# Patient Record
Sex: Female | Born: 1970 | Race: White | Hispanic: No | Smoking: Never smoker
Health system: Southern US, Community
[De-identification: ages and names within clinical notes are randomized; demographics above are authoritative.]

## PROBLEM LIST (undated history)

## (undated) DIAGNOSIS — N12 Tubulo-interstitial nephritis, not specified as acute or chronic: Secondary | ICD-10-CM

## (undated) DIAGNOSIS — B977 Papillomavirus as the cause of diseases classified elsewhere: Secondary | ICD-10-CM

## (undated) DIAGNOSIS — B001 Herpesviral vesicular dermatitis: Secondary | ICD-10-CM

## (undated) HISTORY — DX: Herpesviral vesicular dermatitis: B00.1

## (undated) HISTORY — DX: Papillomavirus as the cause of diseases classified elsewhere: B97.7

## (undated) HISTORY — DX: Tubulo-interstitial nephritis, not specified as acute or chronic: N12

---

## 2003-12-25 ENCOUNTER — Ambulatory Visit: Payer: Self-pay | Admitting: Unknown Physician Specialty

## 2004-12-23 ENCOUNTER — Ambulatory Visit: Payer: Self-pay | Admitting: Unknown Physician Specialty

## 2006-07-20 ENCOUNTER — Ambulatory Visit: Payer: Self-pay | Admitting: Unknown Physician Specialty

## 2007-08-23 ENCOUNTER — Ambulatory Visit: Payer: Self-pay | Admitting: Unknown Physician Specialty

## 2008-09-11 ENCOUNTER — Ambulatory Visit: Payer: Self-pay | Admitting: Unknown Physician Specialty

## 2009-10-08 ENCOUNTER — Ambulatory Visit: Payer: Self-pay | Admitting: Unknown Physician Specialty

## 2010-04-29 ENCOUNTER — Ambulatory Visit: Payer: Self-pay | Admitting: Internal Medicine

## 2010-10-28 ENCOUNTER — Ambulatory Visit: Payer: Self-pay | Admitting: Unknown Physician Specialty

## 2013-11-14 ENCOUNTER — Ambulatory Visit: Payer: Self-pay | Admitting: Unknown Physician Specialty

## 2014-12-02 ENCOUNTER — Encounter: Payer: Self-pay | Admitting: Podiatry

## 2014-12-02 ENCOUNTER — Ambulatory Visit (INDEPENDENT_AMBULATORY_CARE_PROVIDER_SITE_OTHER): Payer: Self-pay | Admitting: Podiatry

## 2014-12-02 VITALS — BP 128/75 | HR 79 | Resp 18

## 2014-12-02 DIAGNOSIS — B351 Tinea unguium: Secondary | ICD-10-CM

## 2014-12-02 MED ORDER — TERBINAFINE HCL 250 MG PO TABS: 250.0000 mg | ORAL_TABLET | Freq: Every day | ORAL | Status: DC

## 2014-12-02 NOTE — Progress Notes (Signed)
   Subjective:    Patient ID: Yvonne Dunlap, female    DOB: September 12, 1970, 44 y.o.   MRN: [id number]  HPI I HAVE SOME TOENAIL FUNGUS ON BOTH BIG TOES AND THE LEFT IS THE WORST AND I DO GET PEDICURES AND THERE IS DISCOLORATION AS WELL. She states that this is been present for the past 6 months and I did not realize was then selected the toenail polish off.    Review of Systems  All other systems reviewed and are negative.      Objective:   Physical Exam: 44 year old female in no apparent distress. Pulses are strongly palpable bilateral. Neurologic sensorium is intact her Semmes-Weinstein monofilament. Deep tendon reflexes are intact and brisk bilateral. Muscle strength +5 over 5 dorsiflexion plantar flexors and inverters everters all intrinsic musculature is intact. Orthopedic evaluation demonstrate all joints distal to the ankle in full range of motion without crepitation. Cutaneous evaluation demonstrates supple well-hydrated cutis she does appear to have yellow dystrophic probably mycotic nail plate hallux bilateral. Only approximately half to distal aspect of the nail plate is affected.      Assessment & Plan:  Onychomycosis hallux bilateral.  Plan: Started her on Lamisil 250 mg tablets 1 by mouth daily for the next 3 months.

## 2015-04-07 ENCOUNTER — Ambulatory Visit: Payer: Self-pay | Admitting: Podiatry

## 2015-04-14 ENCOUNTER — Ambulatory Visit: Payer: Self-pay | Admitting: Podiatry

## 2015-05-05 ENCOUNTER — Encounter: Payer: Self-pay | Admitting: Podiatry

## 2015-05-05 ENCOUNTER — Ambulatory Visit (INDEPENDENT_AMBULATORY_CARE_PROVIDER_SITE_OTHER): Payer: Self-pay | Admitting: Podiatry

## 2015-05-05 ENCOUNTER — Telehealth: Payer: Self-pay | Admitting: *Deleted

## 2015-05-05 DIAGNOSIS — B351 Tinea unguium: Secondary | ICD-10-CM

## 2015-05-05 NOTE — Progress Notes (Signed)
She presents today for follow-up of onychomycosis. She states that my small toes cleared over the hallux of the left foot did not clear up as well.  Objective: Vital signs are stable she is alert and oriented 3. She still has a subungual spot through the hallux nail plate left. No history of psoriasis.  Assessment: Probable onychomycosis associated with the yeast door mold since terbinafine failed to alleviate her symptoms.  Plan: At this point we will try to obtain Sporanox for her and we have requested our [facility] office to do so.

## 2015-05-05 NOTE — Telephone Encounter (Addendum)
-----   Message from [place] sent at 05/05/2015  1:18 PM EST ----- Regarding: Itraconazole  This patient has no insurance and Dr. Milinda Pointer is asking what is the lowest cost per month supply or 90 day supply would be for this patient. He is asking for you to check on this and give the pt a call with cost and if she agrees to go ahead and send in the Rx. Thanks!  05/10/2015-MAILED COPY OF PRICE LISTING for Itraconazole with website, cost, coupons and location for pricing of 60 doses, with instructions to call if she would like a rx.

## 2015-09-15 ENCOUNTER — Other Ambulatory Visit: Payer: Self-pay

## 2015-09-15 MED ORDER — ITRACONAZOLE 100 MG PO CAPS: 100.0000 mg | ORAL_CAPSULE | Freq: Two times a day (BID) | ORAL | Status: DC

## 2015-10-28 ENCOUNTER — Other Ambulatory Visit: Payer: Self-pay | Admitting: Podiatry

## 2015-10-29 ENCOUNTER — Telehealth: Payer: Self-pay | Admitting: *Deleted

## 2015-10-29 ENCOUNTER — Other Ambulatory Visit: Payer: Self-pay

## 2015-10-29 MED ORDER — ITRACONAZOLE 100 MG PO CAPS: 100.0000 mg | ORAL_CAPSULE | Freq: Two times a day (BID) | ORAL | 2 refills | Status: DC

## 2015-10-29 NOTE — Telephone Encounter (Signed)
Pt left name and phone number. Left message asking pt to call again.

## 2017-02-27 ENCOUNTER — Encounter: Payer: Self-pay | Admitting: Obstetrics and Gynecology

## 2017-02-27 ENCOUNTER — Ambulatory Visit (INDEPENDENT_AMBULATORY_CARE_PROVIDER_SITE_OTHER): Payer: Self-pay | Admitting: Obstetrics and Gynecology

## 2017-02-27 VITALS — BP 120/78 | HR 87 | Ht 67.0 in | Wt 204.0 lb

## 2017-02-27 DIAGNOSIS — N898 Other specified noninflammatory disorders of vagina: Secondary | ICD-10-CM

## 2017-02-27 MED ORDER — ACYCLOVIR 200 MG PO CAPS: 200.0000 mg | ORAL_CAPSULE | Freq: Every day | ORAL | 0 refills | Status: AC
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
## 2017-02-27 NOTE — Patient Instructions (Signed)
I value your feedback and entrusting us with your care. If you get a [facility] patient survey, I would appreciate you taking the time to let us know about your experience today. Thank you! 

## 2017-03-08 ENCOUNTER — Telehealth: Payer: Self-pay | Admitting: Obstetrics and Gynecology
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
## 2017-03-12 NOTE — Telephone Encounter (Signed)
LM with results. Recommend checking HSV 2 IgG.

## 2017-03-12 NOTE — Telephone Encounter (Signed)
Pt aware. Vag lesion improving, almost resolved. Will wait to see if sx recur before HSV 2 IgG testing due to self pay.

## 2017-06-01 DIAGNOSIS — D219 Benign neoplasm of connective and other soft tissue, unspecified: Secondary | ICD-10-CM

## 2017-06-01 HISTORY — DX: Benign neoplasm of connective and other soft tissue, unspecified: D21.9

## 2020-01-07 NOTE — Progress Notes (Signed)
Kirk Ruths, MD   Chief Complaint  Patient presents with  . Vaginal exam    dull ache in vag area x couple of months, would like pap if possible    HPI:      [name] is a 49 y.o. [id number] whose LMP was Patient's last menstrual period was 12/11/2019 (approximate)., presents today for vaginal ache (vs pelvic--hard to differentiate) for several months. Sx are intermittent but increasing in frequency. No aggrav/allev factors; hasn't tried NSAIDs. She is sex active, no pain/bleeding. No vag sx, urin sx, GI sx. No LBP, pelvic pain. Not doing heavy lifting. Not exercising.  Due for pap. Last pap neg cell/neg HPV DNA 09/2013. Hx of HPV on pap in past, no bx/tx.  Menses are monthly, lasting 3-4 days, no dysmen.   PT IS SELF PAY  Past Medical History:  Diagnosis Date  . Cold sore   . HPV (human papilloma virus) infection   . Pyelonephritis     Past Surgical History:  Procedure Laterality Date  . CESAREAN SECTION      Family History  Problem Relation Age of Onset  . Thyroid disease Mother   . Breast cancer Mother 17  . Diabetes Father   . Thyroid disease Sister     Social History   Socioeconomic History  . Marital status: Married    Spouse name: Not on file  . Number of children: Not on file  . Years of education: Not on file  . Highest education level: Not on file  Occupational History  . Not on file  Tobacco Use  . Smoking status: Never Smoker  . Smokeless tobacco: Never Used  Vaping Use  . Vaping Use: Never used  Substance and Sexual Activity  . Alcohol use: No    Alcohol/week: 0.0 standard drinks  . Drug use: No  . Sexual activity: Yes    Birth control/protection: None  Other Topics Concern  . Not on file  Social History Narrative  . Not on file   Social Determinants of Health   Financial Resource Strain:   . Difficulty of Paying Living Expenses: Not on file  Food Insecurity:   . Worried About Charity fundraiser in the Last Year: Not  on file  . Ran Out of Food in the Last Year: Not on file  Transportation Needs:   . Lack of Transportation (Medical): Not on file  . Lack of Transportation (Non-Medical): Not on file  Physical Activity:   . Days of Exercise per Week: Not on file  . Minutes of Exercise per Session: Not on file  Stress:   . Feeling of Stress : Not on file  Social Connections:   . Frequency of Communication with Friends and Family: Not on file  . Frequency of Social Gatherings with Friends and Family: Not on file  . Attends Religious Services: Not on file  . Active Member of Clubs or Organizations: Not on file  . Attends Archivist Meetings: Not on file  . Marital Status: Not on file  Intimate Partner Violence:   . Fear of Current or Ex-Partner: Not on file  . Emotionally Abused: Not on file  . Physically Abused: Not on file  . Sexually Abused: Not on file    Outpatient Medications Prior to Visit  Medication Sig Dispense Refill  . itraconazole (SPORANOX) 100 MG capsule Take 1 capsule (100 mg total) by mouth 2 (two) times daily. 30 capsule 2  . terbinafine (  LAMISIL) 250 MG tablet Take 1 tablet (250 mg total) by mouth daily. 90 tablet 0   No facility-administered medications prior to visit.      ROS:  Review of Systems  Constitutional: Negative for fever.  Gastrointestinal: Negative for blood in stool, constipation, diarrhea, nausea and vomiting.  Genitourinary: Positive for vaginal pain. Negative for dyspareunia, dysuria, flank pain, frequency, hematuria, urgency, vaginal bleeding and vaginal discharge.  Musculoskeletal: Negative for back pain.  Skin: Negative for rash.  BREAST: No symptoms   OBJECTIVE:   Vitals:  BP 130/90   Ht 5\' 7"  (1.702 m)   Wt 229 lb (103.9 kg)   LMP 12/11/2019 (Approximate)   BMI 35.87 kg/m   Physical Exam Vitals reviewed.  Constitutional:      Appearance: She is well-developed.  Pulmonary:     Effort: Pulmonary effort is normal.  Abdominal:       Palpations: Abdomen is soft.     Tenderness: There is no abdominal tenderness. There is no guarding or rebound.  Genitourinary:    General: Normal vulva.     Pubic Area: No rash.      Labia:        Right: No rash, tenderness or lesion.        Left: No rash, tenderness or lesion.      Vagina: Normal. No vaginal discharge, erythema or tenderness.     Cervix: Normal.     Uterus: Normal. Not enlarged and not tender.      Adnexa: Right adnexa normal and left adnexa normal.       Right: No mass or tenderness.         Left: No mass or tenderness.       Comments: EXAM DOESN'T CAUSE SX Musculoskeletal:        General: Normal range of motion.     Cervical back: Normal range of motion.  Skin:    General: Skin is warm and dry.  Neurological:     General: No focal deficit present.     Mental Status: She is alert and oriented to person, place, and time.  Psychiatric:        Mood and Affect: Mood normal.        Behavior: Behavior normal.        Thought Content: Thought content normal.        Judgment: Judgment normal.     Assessment/Plan: Vaginal pain--achy pain for several months. Neg exam. Check pap. If neg, question MSK. Try pelvic stretching/increase exercise in meantime. If sx persist, will check GYN u/s.   Cervical cancer screening - Plan: Other/Misc lab test; MDL pap due to self pay  Screening for HPV (human papillomavirus) - Plan: Other/Misc lab test  PT IS SELF PAY   Return if symptoms worsen or fail to improve.  Magdelyn Roebuck B. Sierra Bissonette, PA-C 01/08/2020 11:57 AM

## 2020-01-08 ENCOUNTER — Ambulatory Visit (INDEPENDENT_AMBULATORY_CARE_PROVIDER_SITE_OTHER): Payer: Self-pay | Admitting: Obstetrics and Gynecology

## 2020-01-08 ENCOUNTER — Encounter: Payer: Self-pay | Admitting: Obstetrics and Gynecology

## 2020-01-08 ENCOUNTER — Other Ambulatory Visit: Payer: Self-pay

## 2020-01-08 VITALS — BP 130/90 | Ht 67.0 in | Wt 229.0 lb

## 2020-01-08 DIAGNOSIS — Z1151 Encounter for screening for human papillomavirus (HPV): Secondary | ICD-10-CM

## 2020-01-08 DIAGNOSIS — R102 Pelvic and perineal pain: Secondary | ICD-10-CM

## 2020-01-08 DIAGNOSIS — Z124 Encounter for screening for malignant neoplasm of cervix: Secondary | ICD-10-CM

## 2020-01-08 NOTE — Patient Instructions (Signed)
I value your feedback and entrusting us with your care. If you get a [facility] patient survey, I would appreciate you taking the time to let us know about your experience today. Thank you!  As of February 20, 2019, your lab results will be released to your MyChart immediately, before I even have a chance to see them. Please give me time to review them and contact you if there are any abnormalities. Thank you for your patience.  

## 2020-01-12 ENCOUNTER — Encounter: Payer: Self-pay | Admitting: Obstetrics and Gynecology

## 2020-01-21 ENCOUNTER — Telehealth: Payer: Self-pay | Admitting: Obstetrics and Gynecology

## 2020-01-21 NOTE — Telephone Encounter (Signed)
LM with neg pap/neg HPV DNA results. Pt to f/u re: vaginal vs pelvic pain.

## 2021-01-13 ENCOUNTER — Other Ambulatory Visit: Payer: Self-pay

## 2021-01-13 ENCOUNTER — Ambulatory Visit (INDEPENDENT_AMBULATORY_CARE_PROVIDER_SITE_OTHER): Payer: Self-pay | Admitting: Dermatology

## 2021-01-13 DIAGNOSIS — D489 Neoplasm of uncertain behavior, unspecified: Secondary | ICD-10-CM

## 2021-01-13 DIAGNOSIS — L42 Pityriasis rosea: Secondary | ICD-10-CM

## 2021-01-13 DIAGNOSIS — D239 Other benign neoplasm of skin, unspecified: Secondary | ICD-10-CM

## 2021-01-13 DIAGNOSIS — D2272 Melanocytic nevi of left lower limb, including hip: Secondary | ICD-10-CM

## 2021-01-13 HISTORY — DX: Other benign neoplasm of skin, unspecified: D23.9

## 2021-01-13 MED ORDER — VALACYCLOVIR HCL 500 MG PO TABS: 500.0000 mg | ORAL_TABLET | Freq: Two times a day (BID) | ORAL | 0 refills | Status: AC

## 2021-01-13 MED ORDER — TRIAMCINOLONE ACETONIDE 0.1 % EX CREA
1.0000 | TOPICAL_CREAM | Freq: Two times a day (BID) | CUTANEOUS | 1 refills | Status: DC | PRN
Start: 2021-01-13 — End: 2021-06-07

## 2021-01-13 NOTE — Patient Instructions (Addendum)
Topical steroids (such as triamcinolone, fluocinolone, fluocinonide, mometasone, clobetasol, halobetasol, betamethasone, hydrocortisone) can cause thinning and lightening of the skin if they are used for too long in the same area. Your physician has selected the right strength medicine for your problem and area affected on the body. Please use your medication only as directed by your physician to prevent side effects.   Avoid applying to face, groin, and axilla. Use as directed. Long-term use can cause thinning of the skin.  If you have any questions or concerns for your doctor, please call our main line at [phone] and press option 4 to reach your doctor's medical assistant. If no one answers, please leave a voicemail as directed and we will return your call as soon as possible. Messages left after 4 pm will be answered the following business day.   You may also send [place] a message via [place] [facility]. We typically respond to MyChart messages within 1-2 business days.  For prescription refills, please ask your pharmacy to contact our office. Our fax number is [phone].  If you have an urgent issue when the clinic is closed that cannot wait until the next business day, you can page your doctor at the number below.    Please note that while we do our best to be available for urgent issues outside of office hours, we are not available 24/7.   If you have an urgent issue and are unable to reach [place], you may choose to seek medical care at your doctor's office, retail clinic, urgent care center, or emergency room.  If you have a medical emergency, please immediately call 911 or go to the emergency department.  Pager Numbers  - Dr. Nehemiah Massed: [phone]  - Dr. Laurence Ferrari: [phone]  - Dr. Nicole Kindred: [phone]  In the event of inclement weather, please call our main line at [phone] for an update on the status of any delays or closures.  Dermatology Medication Tips: Please keep the boxes that  topical medications come in in order to help keep track of the instructions about where and how to use these. Pharmacies typically print the medication instructions only on the boxes and not directly on the medication tubes.   If your medication is too expensive, please contact our office at [phone] option 4 or send [place] a message through [place].   We are unable to tell what your co-pay for medications will be in advance as this is different depending on your insurance coverage. However, we may be able to find a substitute medication at lower cost or fill out paperwork to get insurance to cover a needed medication.   If a prior authorization is required to get your medication covered by your insurance company, please allow [place] 1-2 business days to complete this process.  Drug prices often vary depending on where the prescription is filled and some pharmacies may offer cheaper prices.  The website www.goodrx.com contains coupons for medications through different pharmacies. The prices here do not account for what the cost may be with help from insurance (it may be cheaper with your insurance), but the website can give you the price if you did not use any insurance.  - You can print the associated coupon and take it with your prescription to the pharmacy.  - You may also stop by our office during regular business hours and pick up a GoodRx coupon card.  - If you need your prescription sent electronically to a different pharmacy, notify our office through [facility]  or by phone at [phone] option 4.     If you have any questions or concerns for your doctor, please call our main line at [phone] and press option 4 to reach your doctor's medical assistant. If no one answers, please leave a voicemail as directed and we will return your call as soon as possible. Messages left after 4 pm will be answered the following business day.   You may also send [place] a message via [place]. We  typically respond to MyChart messages within 1-2 business days.  For prescription refills, please ask your pharmacy to contact our office. Our fax number is [phone].  If you have an urgent issue when the clinic is closed that cannot wait until the next business day, you can page your doctor at the number below.    Please note that while we do our best to be available for urgent issues outside of office hours, we are not available 24/7.   If you have an urgent issue and are unable to reach [place], you may choose to seek medical care at your doctor's office, retail clinic, urgent care center, or emergency room.  If you have a medical emergency, please immediately call 911 or go to the emergency department.  Pager Numbers  - Dr. Nehemiah Massed: [phone]  - Dr. Laurence Ferrari: [phone]  - Dr. Nicole Kindred: [phone]  In the event of inclement weather, please call our main line at [phone] for an update on the status of any delays or closures.  Dermatology Medication Tips: Please keep the boxes that topical medications come in in order to help keep track of the instructions about where and how to use these. Pharmacies typically print the medication instructions only on the boxes and not directly on the medication tubes.   If your medication is too expensive, please contact our office at [phone] option 4 or send [place] a message through [place].   We are unable to tell what your co-pay for medications will be in advance as this is different depending on your insurance coverage. However, we may be able to find a substitute medication at lower cost or fill out paperwork to get insurance to cover a needed medication.   If a prior authorization is required to get your medication covered by your insurance company, please allow [place] 1-2 business days to complete this process.  Drug prices often vary depending on where the prescription is filled and some pharmacies may offer cheaper prices.  The  website www.goodrx.com contains coupons for medications through different pharmacies. The prices here do not account for what the cost may be with help from insurance (it may be cheaper with your insurance), but the website can give you the price if you did not use any insurance.  - You can print the associated coupon and take it with your prescription to the pharmacy.  - You may also stop by our office during regular business hours and pick up a GoodRx coupon card.  - If you need your prescription sent electronically to a different pharmacy, notify our office through [facility] or by phone at [phone] option 4.

## 2021-01-13 NOTE — Progress Notes (Signed)
New Patient Visit  Subjective  Yvonne Dunlap is a 50 y.o. female who presents for the following: New Patient (Initial Visit) (Patient here today to re-establish care. Patient here today for a concern with rash at mid chest under bra and at breast. Patient reports rash started 3 weeks ago. Patient denies using anything to treat area. She also reports a mole at left foot she would like checked today. /).  The following portions of the chart were reviewed this encounter and updated as appropriate:   Tobacco  Allergies  Meds  Problems  Med Hx  Surg Hx  Fam Hx     Review of Systems:  No other skin or systemic complaints except as noted in HPI or Assessment and Plan.  Objective  Well appearing patient in no apparent distress; mood and affect are within normal limits.  A focused examination was performed including chest, bilateral inframammary and left foot. Relevant physical exam findings are noted in the Assessment and Plan.  inframmamary, bilateral breast pityriasis rosea   left medial heel 0.6 x 0.3 cm slightly irregular brown macule         Assessment & Plan  Pityriasis rosea inframmamary, bilateral breast  Discussed could be caused viral infection. Discussed steroid cream, antihistamines, and antiviral drugs can help clear.   Start  Triamcinolone cream 0.1 % cream - apply 1 application topically 2 (two) times daily as needed (Rash). Use for 2 weeks for rash at breast and under breast. After 2 weeks use 5 days week if needed. Avoid applying to face, groin, and axilla  Start Valtrex 500 mg tablet - Take 1 tablet by mouth 2 times daily for 7 days. (Due to association with Human Herpesvirus 6)  Topical steroids (such as triamcinolone, fluocinolone, fluocinonide, mometasone, clobetasol, halobetasol, betamethasone, hydrocortisone) can cause thinning and lightening of the skin if they are used for too long in the same area. Your physician has selected the right strength  medicine for your problem and area affected on the body. Please use your medication only as directed by your physician to prevent side effects.   triamcinolone cream (KENALOG) 0.1 % - inframmamary, bilateral breast Apply 1 application topically 2 (two) times daily as needed (Rash). Use for 2 weeks for rash at breast and under breast. After 2 weeks use 5 days week if needed. Avoid applying to face, groin, and axilla. Use as directed.  valACYclovir (VALTREX) 500 MG tablet - inframmamary, bilateral breast Take 1 tablet (500 mg total) by mouth 2 (two) times daily for 7 days.  Neoplasm of uncertain behavior left medial heel  Skin / nail biopsy Type of biopsy: tangential   Informed consent: discussed and consent obtained   Timeout: patient name, date of birth, surgical site, and procedure verified   Procedure prep:  Patient was prepped and draped in usual sterile fashion Prep type:  Isopropyl alcohol Anesthesia: the lesion was anesthetized in a standard fashion   Anesthetic:  1% lidocaine w/ epinephrine 1-100,000 buffered w/ 8.4% NaHCO3 Instrument used: flexible razor blade   Hemostasis achieved with: pressure, aluminum chloride and electrodesiccation   Outcome: patient tolerated procedure well   Post-procedure details: sterile dressing applied and wound care instructions given   Dressing type: petrolatum and bandage    Specimen 1 - Surgical pathology Differential Diagnosis: r/o dysplastic nevus Check Margins: No R/o dysplastic nevus   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 In these sections, the edges are free.   Return for 1 year tbse.  IRuthell Rummage, CMA, am acting as scribe for Sarina Ser, MD. Documentation: I have reviewed the above documentation for accuracy and completeness, and I agree with the above.  Sarina Ser, MD

## 2021-01-17 ENCOUNTER — Encounter: Payer: Self-pay | Admitting: Dermatology

## 2021-01-17 ENCOUNTER — Telehealth: Payer: Self-pay

## 2021-01-17 NOTE — Telephone Encounter (Signed)
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

## 2021-01-17 NOTE — Telephone Encounter (Signed)
Voicemail box full unable to leave a message.  

## 2021-01-18 ENCOUNTER — Encounter: Payer: Self-pay | Admitting: Dermatology

## 2021-06-06 NOTE — Progress Notes (Signed)
? ?PCP: Kirk Ruths, MD ? ? ?Chief Complaint  ?Patient presents with  ? Gynecologic Exam  ?  No concerns  ? ? ?HPI: ?     [name] is a 51 y.o. [id number] whose LMP was No LMP recorded (lmp unknown)., presents today for her annual examination.  Her menses are absent for almost a year. No vag bleeding/spotting, no pelvic pain. No vasomotor sx.  ? ?Sex activity: single partner, contraception - none. She does not have vaginal dryness. ? ?Last Pap: 01/12/20 Results were: no abnormalities /neg HPV DNA. Hx of abn pap in past without bx/tx.  ? ?Last mammogram: 11/14/13  Results were: normal--routine follow-up in 12 months ?There is a  FH of breast cancer in her mom, genetic testing not indicated. There is no FH of ovarian cancer. The patient does do self-breast exams. ? ?Colonoscopy: never ? ?Tobacco use: The patient denies current or previous tobacco use. ?Alcohol use: none ?No drug use ?Exercise: not active ? ?She does get adequate calcium but not Vitamin D in her diet. ? ?No recent labs, not fasting today. ? ?PT IS SELF PAY ? ?Patient Active Problem List  ? Diagnosis Date Noted  ? Family history of breast cancer 06/07/2021  ? ? ?Past Surgical History:  ?Procedure Laterality Date  ? CESAREAN SECTION    ? ? ?Family History  ?Problem Relation Age of Onset  ? Thyroid disease Mother   ? Breast cancer Mother 41  ? Diabetes Father   ? Thyroid disease Sister   ? ? ?Social History  ? ?Socioeconomic History  ? Marital status: Divorced  ?  Spouse name: Not on file  ? Number of children: Not on file  ? Years of education: Not on file  ? Highest education level: Not on file  ?Occupational History  ? Not on file  ?Tobacco Use  ? Smoking status: Never  ? Smokeless tobacco: Never  ?Vaping Use  ? Vaping Use: Never used  ?Substance and Sexual Activity  ? Alcohol use: No  ?  Alcohol/week: 0.0 standard drinks  ? Drug use: No  ? Sexual activity: Yes  ?  Birth control/protection: None  ?Other Topics Concern  ? Not on file   ?Social History Narrative  ? Not on file  ? ?Social Determinants of Health  ? ?Financial Resource Strain: Not on file  ?Food Insecurity: Not on file  ?Transportation Needs: Not on file  ?Physical Activity: Not on file  ?Stress: Not on file  ?Social Connections: Not on file  ?Intimate Partner Violence: Not on file  ? ? ?No current outpatient medications on file. ? ? ? ? ?ROS: ? ?Review of Systems  ?Constitutional:  Negative for fatigue, fever and unexpected weight change.  ?Respiratory:  Negative for cough, shortness of breath and wheezing.   ?Cardiovascular:  Negative for chest pain, palpitations and leg swelling.  ?Gastrointestinal:  Negative for blood in stool, constipation, diarrhea, nausea and vomiting.  ?Endocrine: Negative for cold intolerance, heat intolerance and polyuria.  ?Genitourinary:  Negative for dyspareunia, dysuria, flank pain, frequency, genital sores, hematuria, menstrual problem, pelvic pain, urgency, vaginal bleeding, vaginal discharge and vaginal pain.  ?Musculoskeletal:  Negative for back pain, joint swelling and myalgias.  ?Skin:  Negative for rash.  ?Neurological:  Negative for dizziness, syncope, light-headedness, numbness and headaches.  ?Hematological:  Negative for adenopathy.  ?Psychiatric/Behavioral:  Negative for agitation, confusion, sleep disturbance and suicidal ideas. The patient is not nervous/anxious.   ?BREAST: No symptoms ? ? ? ?  Objective: ?BP 140/90   Ht '5\' 7"'$  (1.702 m)   Wt 235 lb (106.6 kg)   LMP  (LMP Unknown)   BMI 36.81 kg/m?  ? ? ?Physical Exam ?Constitutional:   ?   Appearance: She is well-developed.  ?Genitourinary:  ?   Vulva normal.  ?   Right Labia: No rash, tenderness or lesions. ?   Left Labia: No tenderness, lesions or rash. ?   No vaginal discharge, erythema or tenderness.  ? ?   Right Adnexa: not tender and no mass present. ?   Left Adnexa: not tender and no mass present. ?   No cervical friability or polyp.  ?   Uterus is not enlarged or tender.   ?Breasts: ?   Right: No mass, nipple discharge, skin change or tenderness.  ?   Left: No mass, nipple discharge, skin change or tenderness.  ?Neck:  ?   Thyroid: No thyromegaly.  ?Cardiovascular:  ?   Rate and Rhythm: Normal rate and regular rhythm.  ?   Heart sounds: Normal heart sounds. No murmur heard. ?Pulmonary:  ?   Effort: Pulmonary effort is normal.  ?   Breath sounds: Normal breath sounds.  ?Abdominal:  ?   Palpations: Abdomen is soft.  ?   Tenderness: There is no abdominal tenderness. There is no guarding or rebound.  ?Musculoskeletal:     ?   General: Normal range of motion.  ?   Cervical back: Normal range of motion.  ?Lymphadenopathy:  ?   Cervical: No cervical adenopathy.  ?Neurological:  ?   General: No focal deficit present.  ?   Mental Status: She is alert and oriented to person, place, and time.  ?   Cranial Nerves: No cranial nerve deficit.  ?Skin: ?   General: Skin is warm and dry.  ?Psychiatric:     ?   Mood and Affect: Mood normal.     ?   Behavior: Behavior normal.     ?   Thought Content: Thought content normal.     ?   Judgment: Judgment normal.  ?Vitals reviewed.  ? ? ?Assessment/Plan: ? ?Encounter for annual routine gynecological examination ? ?Encounter for screening mammogram for malignant neoplasm of breast - Plan: MM 3D SCREEN BREAST BILATERAL; pt to schedule mammo ? ?Family history of breast cancer--doesn't meet cancer genetic testing guidelines ? ?Screening for colon cancer - Plan: Ambulatory referral to Gastroenterology; refer to GI for scr colonoscopy due to age ? ?Blood tests for routine general physical examination - Plan: Comprehensive metabolic panel, Lipid panel, Lipid panel, Comprehensive metabolic panel,  ? ?Screening cholesterol level - Plan: Lipid panel, Lipid panel,  ? ?        ?GYN counsel breast self exam, mammography screening, menopause, adequate intake of calcium and vitamin D, diet and exercise ? ?  F/U ? Return in about 1 year (around 06/08/2022). ? ?Audrena Talaga B.  Kortland Nichols, PA-C ?06/07/2021 ?9:35 AM ?

## 2021-06-07 ENCOUNTER — Encounter: Payer: Self-pay | Admitting: Obstetrics and Gynecology

## 2021-06-07 ENCOUNTER — Other Ambulatory Visit: Payer: Self-pay

## 2021-06-07 ENCOUNTER — Ambulatory Visit (INDEPENDENT_AMBULATORY_CARE_PROVIDER_SITE_OTHER): Payer: Self-pay | Admitting: Obstetrics and Gynecology

## 2021-06-07 VITALS — BP 140/90 | Ht 67.0 in | Wt 235.0 lb

## 2021-06-07 DIAGNOSIS — Z13220 Encounter for screening for lipoid disorders: Secondary | ICD-10-CM

## 2021-06-07 DIAGNOSIS — Z01419 Encounter for gynecological examination (general) (routine) without abnormal findings: Secondary | ICD-10-CM

## 2021-06-07 DIAGNOSIS — Z803 Family history of malignant neoplasm of breast: Secondary | ICD-10-CM | POA: Insufficient documentation

## 2021-06-07 DIAGNOSIS — Z1231 Encounter for screening mammogram for malignant neoplasm of breast: Secondary | ICD-10-CM

## 2021-06-07 DIAGNOSIS — Z1211 Encounter for screening for malignant neoplasm of colon: Secondary | ICD-10-CM

## 2021-06-07 DIAGNOSIS — Z0000 Encounter for general adult medical examination without abnormal findings: Secondary | ICD-10-CM

## 2021-06-07 NOTE — Patient Instructions (Signed)
I value your feedback and you entrusting us with your care. If you get a [facility] patient survey, I would appreciate you taking the time to let us know about your experience today. Thank you!  [facility]: [phone]  [facility]: [phone]    

## 2021-06-08 ENCOUNTER — Telehealth: Payer: Self-pay

## 2021-06-08 NOTE — Telephone Encounter (Signed)
CALLED PATIENT NO ANSWER LEFT VOICEMAIL FOR A CALL BACK ? ?

## 2021-06-09 ENCOUNTER — Telehealth: Payer: Self-pay

## 2021-06-09 NOTE — Telephone Encounter (Signed)
CALLED PATIENT NO ANSWER LEFT VOICEMAIL FOR A CALL BACK LETTER SENT 

## 2021-07-22 ENCOUNTER — Ambulatory Visit
Admission: RE | Admit: 2021-07-22 | Discharge: 2021-07-22 | Disposition: A | Discharge status: Home / Self Care | Payer: Self-pay | Source: Ambulatory Visit | Attending: Obstetrics and Gynecology | Admitting: Obstetrics and Gynecology

## 2021-07-22 ENCOUNTER — Encounter: Payer: Self-pay | Admitting: Radiology

## 2021-07-22 DIAGNOSIS — Z1231 Encounter for screening mammogram for malignant neoplasm of breast: Secondary | ICD-10-CM | POA: Insufficient documentation

## 2021-07-26 ENCOUNTER — Other Ambulatory Visit: Payer: Self-pay | Admitting: Obstetrics and Gynecology

## 2021-07-26 DIAGNOSIS — R928 Other abnormal and inconclusive findings on diagnostic imaging of breast: Secondary | ICD-10-CM

## 2021-07-26 DIAGNOSIS — N63 Unspecified lump in unspecified breast: Secondary | ICD-10-CM

## 2021-08-11 ENCOUNTER — Ambulatory Visit
Admission: RE | Admit: 2021-08-11 | Discharge: 2021-08-11 | Disposition: A | Discharge status: Home / Self Care | Payer: Self-pay | Source: Ambulatory Visit | Attending: Obstetrics and Gynecology | Admitting: Obstetrics and Gynecology

## 2021-08-11 DIAGNOSIS — R928 Other abnormal and inconclusive findings on diagnostic imaging of breast: Secondary | ICD-10-CM

## 2021-08-11 DIAGNOSIS — N63 Unspecified lump in unspecified breast: Secondary | ICD-10-CM

## 2021-08-12 ENCOUNTER — Other Ambulatory Visit: Payer: Self-pay | Admitting: Obstetrics and Gynecology

## 2021-08-12 DIAGNOSIS — N63 Unspecified lump in unspecified breast: Secondary | ICD-10-CM

## 2021-08-12 DIAGNOSIS — R928 Other abnormal and inconclusive findings on diagnostic imaging of breast: Secondary | ICD-10-CM

## 2021-08-25 ENCOUNTER — Other Ambulatory Visit: Payer: Self-pay | Admitting: Obstetrics and Gynecology

## 2021-08-25 DIAGNOSIS — R928 Other abnormal and inconclusive findings on diagnostic imaging of breast: Secondary | ICD-10-CM

## 2021-08-25 DIAGNOSIS — R921 Mammographic calcification found on diagnostic imaging of breast: Secondary | ICD-10-CM

## 2021-09-09 ENCOUNTER — Ambulatory Visit
Admission: RE | Admit: 2021-09-09 | Discharge: 2021-09-09 | Disposition: A | Discharge status: Home / Self Care | Payer: Self-pay | Source: Ambulatory Visit | Attending: Obstetrics and Gynecology | Admitting: Obstetrics and Gynecology

## 2021-09-09 DIAGNOSIS — R921 Mammographic calcification found on diagnostic imaging of breast: Secondary | ICD-10-CM

## 2021-09-09 DIAGNOSIS — R928 Other abnormal and inconclusive findings on diagnostic imaging of breast: Secondary | ICD-10-CM

## 2021-09-09 DIAGNOSIS — N63 Unspecified lump in unspecified breast: Secondary | ICD-10-CM

## 2021-09-09 HISTORY — PX: BREAST BIOPSY: SHX20

## 2021-09-12 LAB — SURGICAL PATHOLOGY

## 2022-01-18 ENCOUNTER — Ambulatory Visit: Payer: Self-pay | Admitting: Dermatology

## 2022-06-27 ENCOUNTER — Ambulatory Visit (INDEPENDENT_AMBULATORY_CARE_PROVIDER_SITE_OTHER): Payer: Self-pay | Admitting: Dermatology

## 2022-06-27 VITALS — BP 131/77

## 2022-06-27 DIAGNOSIS — D2372 Other benign neoplasm of skin of left lower limb, including hip: Secondary | ICD-10-CM

## 2022-06-27 DIAGNOSIS — D239 Other benign neoplasm of skin, unspecified: Secondary | ICD-10-CM

## 2022-06-27 NOTE — Patient Instructions (Signed)
Due to recent changes in healthcare laws, you may see results of your pathology and/or laboratory studies on MyChart before the doctors have had a chance to review them. We understand that in some cases there may be results that are confusing or concerning to you. Please understand that not all results are received at the same time and often the doctors may need to interpret multiple results in order to provide you with the best plan of care or course of treatment. Therefore, we ask that you please give us 2 business days to thoroughly review all your results before contacting the office for clarification. Should we see a critical lab result, you will be contacted sooner.   If You Need Anything After Your Visit  If you have any questions or concerns for your doctor, please call our main line at [phone] and press option 4 to reach your doctor's medical assistant. If no one answers, please leave a voicemail as directed and we will return your call as soon as possible. Messages left after 4 pm will be answered the following business day.   You may also send us a message via MyChart. We typically respond to MyChart messages within 1-2 business days.  For prescription refills, please ask your pharmacy to contact our office. Our fax number is [phone].  If you have an urgent issue when the clinic is closed that cannot wait until the next business day, you can page your doctor at the number below.    Please note that while we do our best to be available for urgent issues outside of office hours, we are not available 24/7.   If you have an urgent issue and are unable to reach us, you may choose to seek medical care at your doctor's office, retail clinic, urgent care center, or emergency room.  If you have a medical emergency, please immediately call 911 or go to the emergency department.  Pager Numbers  - Dr. Kowalski: [phone]  - Dr. Moye: [phone]  - Dr. Stewart:  [phone]  In the event of inclement weather, please call our main line at [phone] for an update on the status of any delays or closures.  Dermatology Medication Tips: Please keep the boxes that topical medications come in in order to help keep track of the instructions about where and how to use these. Pharmacies typically print the medication instructions only on the boxes and not directly on the medication tubes.   If your medication is too expensive, please contact our office at [phone] option 4 or send us a message through MyChart.   We are unable to tell what your co-pay for medications will be in advance as this is different depending on your insurance coverage. However, we may be able to find a substitute medication at lower cost or fill out paperwork to get insurance to cover a needed medication.   If a prior authorization is required to get your medication covered by your insurance company, please allow us 1-2 business days to complete this process.  Drug prices often vary depending on where the prescription is filled and some pharmacies may offer cheaper prices.  The website www.goodrx.com contains coupons for medications through different pharmacies. The prices here do not account for what the cost may be with help from insurance (it may be cheaper with your insurance), but the website can give you the price if you did not use any insurance.  - You can print the associated coupon and take it with   your prescription to the pharmacy.  - You may also stop by our office during regular business hours and pick up a GoodRx coupon card.  - If you need your prescription sent electronically to a different pharmacy, notify our office through [facility] MyChart or by phone at [phone] option 4.     Si Usted Necesita Algo Despus de Su Visita  Tambin puede enviarnos un mensaje a travs de MyChart. Por lo general respondemos a los mensajes de MyChart en el transcurso de 1 a 2  das hbiles.  Para renovar recetas, por favor pida a su farmacia que se ponga en contacto con nuestra oficina. Nuestro nmero de fax es el [phone].  Si tiene un asunto urgente cuando la clnica est cerrada y que no puede esperar hasta el siguiente da hbil, puede llamar/localizar a su doctor(a) al nmero que aparece a continuacin.   Por favor, tenga en cuenta que aunque hacemos todo lo posible para estar disponibles para asuntos urgentes fuera del horario de oficina, no estamos disponibles las 24 horas del da, los 7 das de la semana.   Si tiene un problema urgente y no puede comunicarse con nosotros, puede optar por buscar atencin mdica  en el consultorio de su doctor(a), en una clnica privada, en un centro de atencin urgente o en una sala de emergencias.  Si tiene una emergencia mdica, por favor llame inmediatamente al 911 o vaya a la sala de emergencias.  Nmeros de bper  - Dr. Kowalski: [phone]  - Dra. Moye: [phone]  - Dra. Stewart: [phone]  En caso de inclemencias del tiempo, por favor llame a nuestra lnea principal al [phone] para una actualizacin sobre el estado de cualquier retraso o cierre.  Consejos para la medicacin en dermatologa: Por favor, guarde las cajas en las que vienen los medicamentos de uso tpico para ayudarle a seguir las instrucciones sobre dnde y cmo usarlos. Las farmacias generalmente imprimen las instrucciones del medicamento slo en las cajas y no directamente en los tubos del medicamento.   Si su medicamento es muy caro, por favor, pngase en contacto con nuestra oficina llamando al [phone] y presione la opcin 4 o envenos un mensaje a travs de MyChart.   No podemos decirle cul ser su copago por los medicamentos por adelantado ya que esto es diferente dependiendo de la cobertura de su seguro. Sin embargo, es posible que podamos encontrar un medicamento sustituto a menor costo o llenar un formulario para que el  seguro cubra el medicamento que se considera necesario.   Si se requiere una autorizacin previa para que su compaa de seguros cubra su medicamento, por favor permtanos de 1 a 2 das hbiles para completar este proceso.  Los precios de los medicamentos varan con frecuencia dependiendo del lugar de dnde se surte la receta y alguna farmacias pueden ofrecer precios ms baratos.  El sitio web www.goodrx.com tiene cupones para medicamentos de diferentes farmacias. Los precios aqu no tienen en cuenta lo que podra costar con la ayuda del seguro (puede ser ms barato con su seguro), pero el sitio web puede darle el precio si no utiliz ningn seguro.  - Puede imprimir el cupn correspondiente y llevarlo con su receta a la farmacia.  - Tambin puede pasar por nuestra oficina durante el horario de atencin regular y recoger una tarjeta de cupones de GoodRx.  - Si necesita que su receta se enve electrnicamente a una farmacia diferente, informe a nuestra oficina a travs de MyChart de    o por telfono llamando al [phone] y presione la opcin 4.  

## 2022-06-27 NOTE — Progress Notes (Signed)
   Follow-Up Visit   Subjective  Yvonne Dunlap is a 52 y.o. female who presents for the following: check spot, L hip area, 68m, no symptoms The patient has spots, moles and lesions to be evaluated, some may be new or changing and the patient may have concern these could be cancer.  The following portions of the chart were reviewed this encounter and updated as appropriate: medications, allergies, medical history  Review of Systems:  No other skin or systemic complaints except as noted in HPI or Assessment and Plan.  Objective  Well appearing patient in no apparent distress; mood and affect are within normal limits.  A focused examination was performed of the following areas: Left hip Relevant exam findings are noted in the Assessment and Plan.   Assessment & Plan   DERMATOFIBROMA L lat prox thigh post aspect Exam: 0.6 x 0.3cm firm pink/brown papulenodule with dimple sign. Treatment Plan: Benign-appearing.  Observation.  Call clinic for new or changing lesions.    Return if symptoms worsen or fail to improve.  I, Ardis Rowan, RMA, am acting as scribe for Armida Sans, MD .  Documentation: I have reviewed the above documentation for accuracy and completeness, and I agree with the above.  Armida Sans, MD

## 2022-07-10 ENCOUNTER — Encounter: Payer: Self-pay | Admitting: Dermatology

## 2022-12-25 IMAGING — US US BREAST*L* LIMITED INC AXILLA
1 series · 8 of 8 positions shown · non-contrast
Comparison: Previous exam(s).

CLINICAL DATA: Recall for possible mass and separate group of
calcifications in the left breast.

EXAM:
DIGITAL DIAGNOSTIC UNILATERAL LEFT MAMMOGRAM WITH TOMOSYNTHESIS AND
CAD; ULTRASOUND LEFT BREAST LIMITED
TECHNIQUE: Left digital diagnostic mammography and breast tomosynthesis was
performed. The images were evaluated with computer-aided detection.;
Targeted ultrasound examination of the left breast was performed.

[Series 1: us breast*left* limited inc axilla · 0.06mm/px · 8 of 8 slices shown]
[im 1/8]
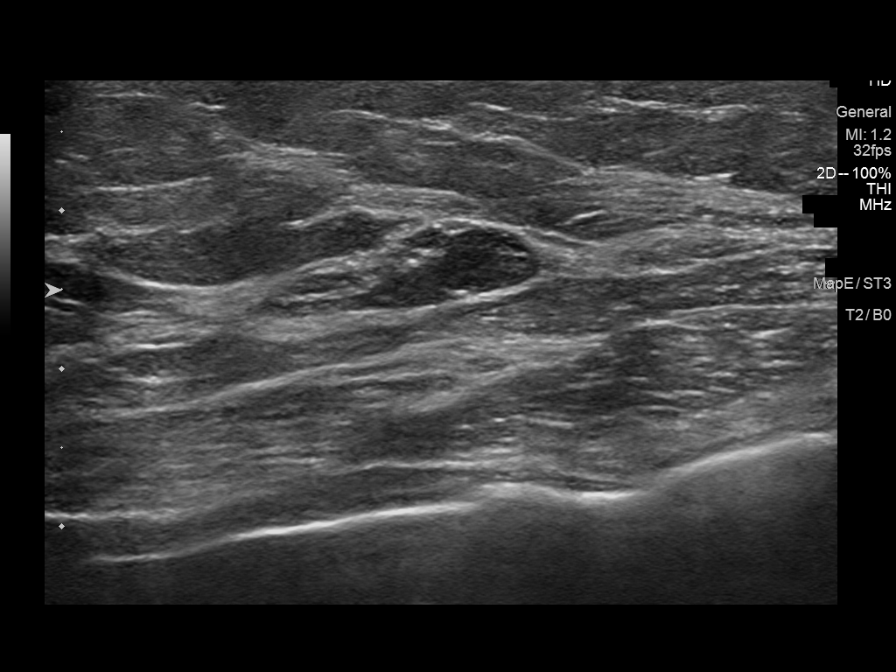
[im 2/8]
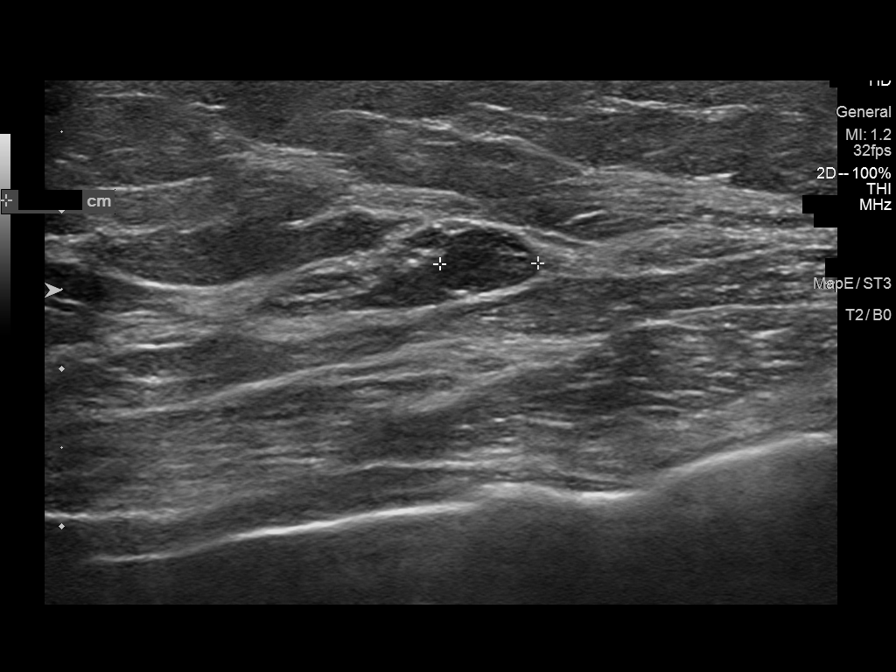
[im 3/8]
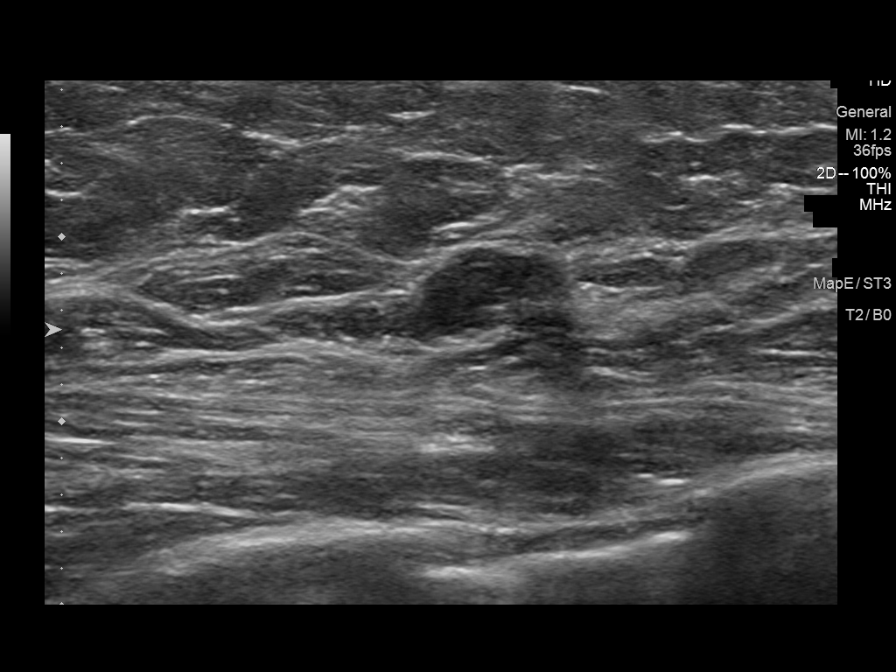
[im 4/8]
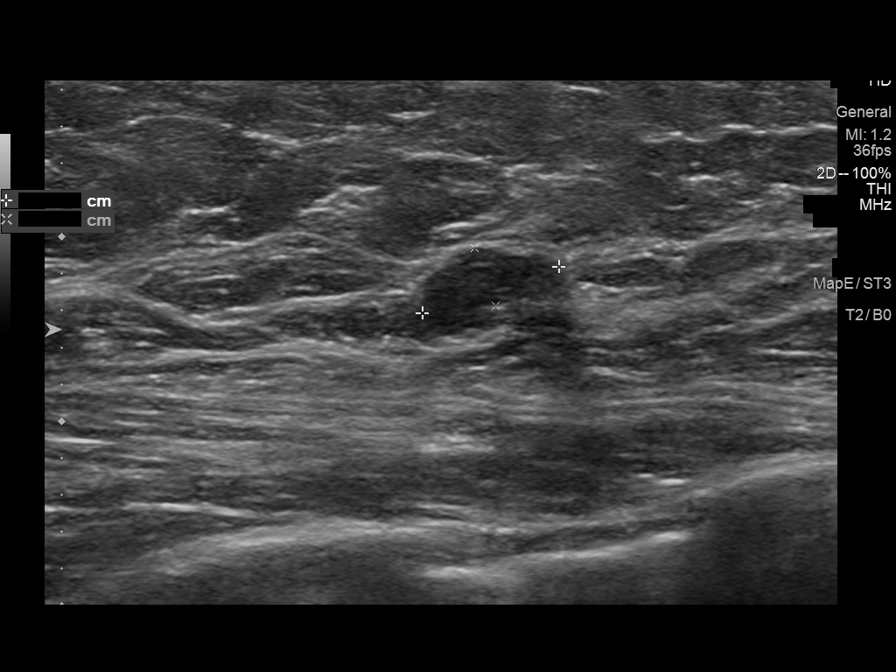
[im 5/8]
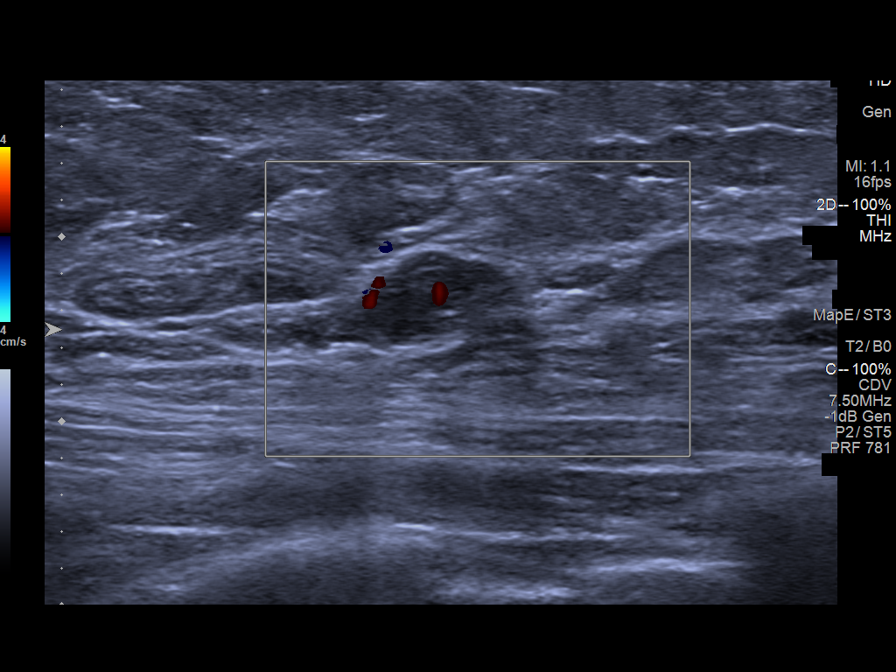
[im 6/8]
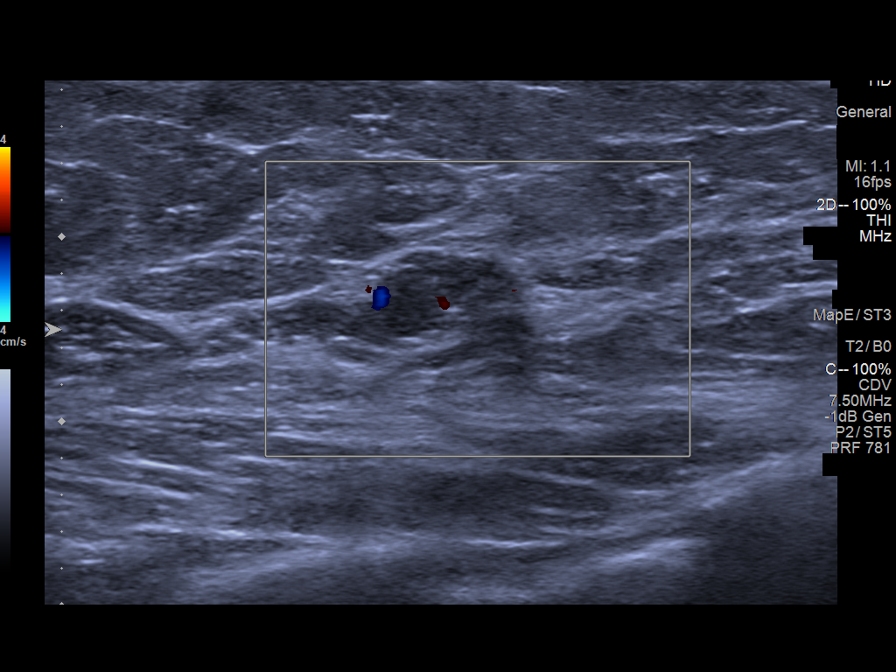
[im 7/8]
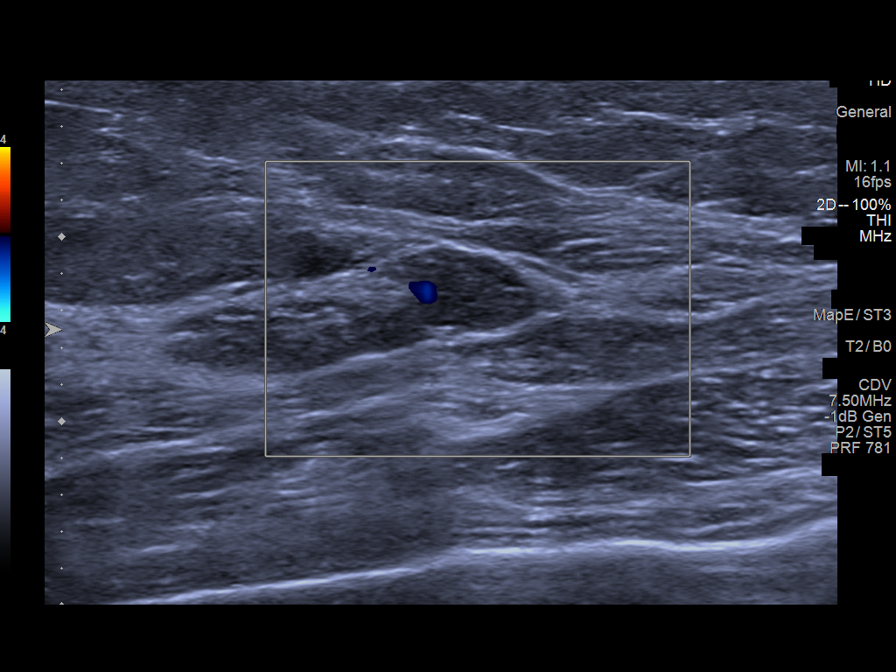
[im 8/8]
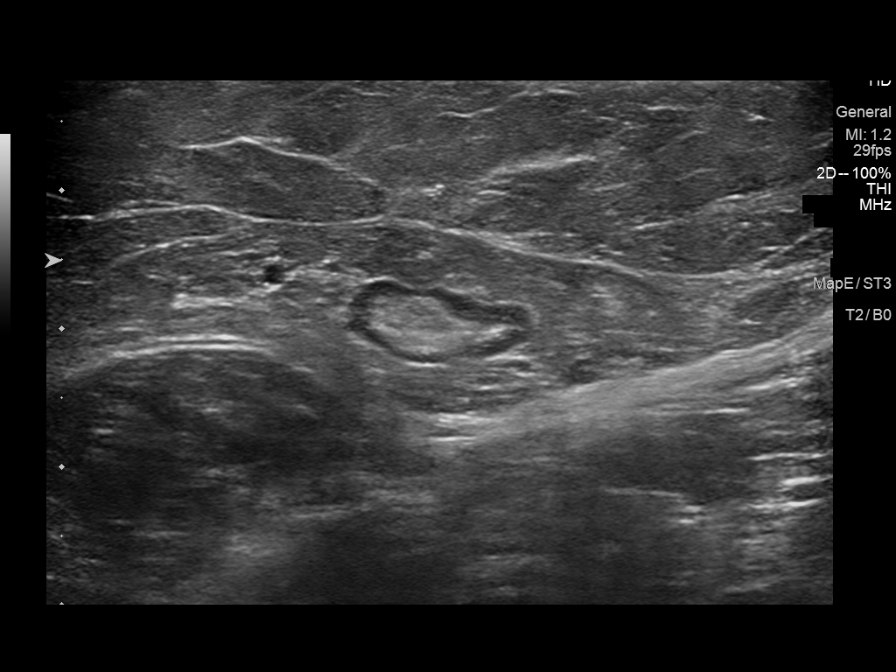

[8 of 8 positions shown; findings below may reference images not displayed]

ACR Breast Density Category c: The breast tissue is heterogeneously
dense, which may obscure small masses.
FINDINGS: The previously noted possible mass persists on additional views as a
0.9 cm oval circumscribed mass in the upper outer breast at far
posterior depth. The mass is not visualized on the full field true
lateral view due to the far posterior location.

Additionally, grouped round and punctate calcifications spanning
cm are noted in the upper outer breast at middle to posterior depth.
The calcifications in the inferior aspect of the group demonstrate
layering on lateral view, consistent with benign milk of calcium.
However, the calcifications at the superior aspect of the group do
not change morphology on lateral view.

Targeted ultrasound of the left breast at the 3 o'clock position 13
cm from the nipple demonstrates a 0.6 x 0.8 x 0.3 cm oval hypoechoic
mass with partially circumscribed, partially indistinct margins,
corresponding to the mammographic finding. There is a small central
hyperechoic area with internal vascularity suggestive of a fatty
hilum.

Targeted ultrasound of the left axilla demonstrates lymph nodes with
normal morphology.
IMPRESSION: 1. Indeterminate mass at the left breast 3 o'clock position may
represent an intramammary lymph node with diffuse cortical
thickening. Recommend ultrasound-guided biopsy for definitive
characterization.
2. Indeterminate grouped calcifications in the upper outer left
breast. Recommend stereotactic guided biopsy.
3. No left axillary lymphadenopathy.

RECOMMENDATION:
Left breast ultrasound-guided biopsy of the mass at the 3 o'clock
position.

Left breast stereotactic guided biopsy of calcifications in the
upper outer breast.

I have discussed the findings and recommendations with the patient.
The biopsy procedures were discussed with the patient and questions
were answered. Patient expressed their understanding of the biopsy
recommendations. We will obtain an order for the procedures from the
patient's clinical provider and schedule the patient at her earliest
convenience.

BI-RADS CATEGORY  4: Suspicious.

## 2022-12-25 IMAGING — MG MM DIGITAL DIAGNOSTIC UNILAT*L* W/ TOMO W/ CAD
8 series · 8 of 20 positions shown · non-contrast
Comparison: Previous exam(s).

CLINICAL DATA: Recall for possible mass and separate group of
calcifications in the left breast.

EXAM:
DIGITAL DIAGNOSTIC UNILATERAL LEFT MAMMOGRAM WITH TOMOSYNTHESIS AND
CAD; ULTRASOUND LEFT BREAST LIMITED
TECHNIQUE: Left digital diagnostic mammography and breast tomosynthesis was
performed. The images were evaluated with computer-aided detection.;
Targeted ultrasound examination of the left breast was performed.

[L CC]
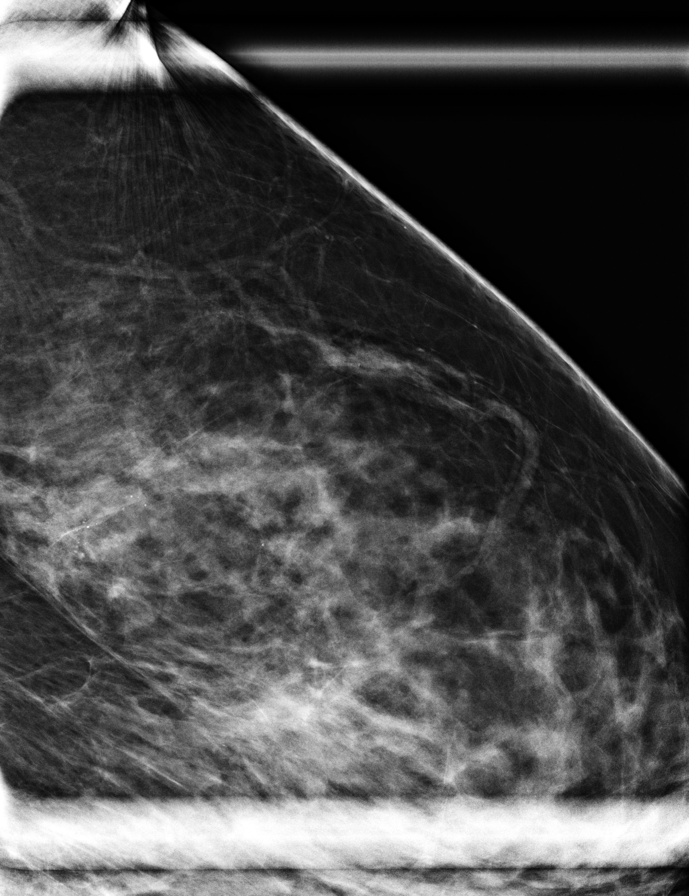

[L ML]
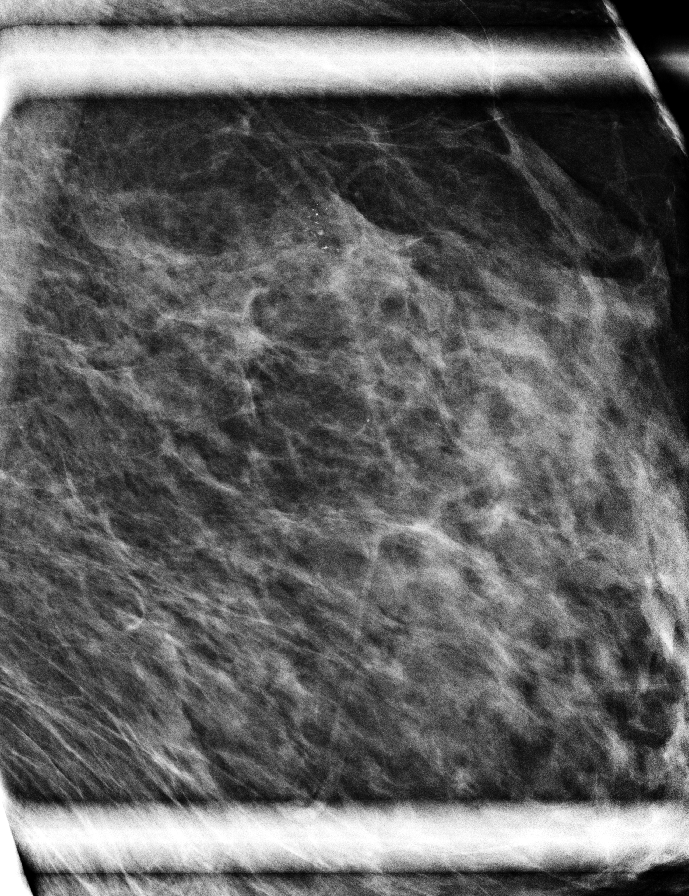

[L ML synth-2D]
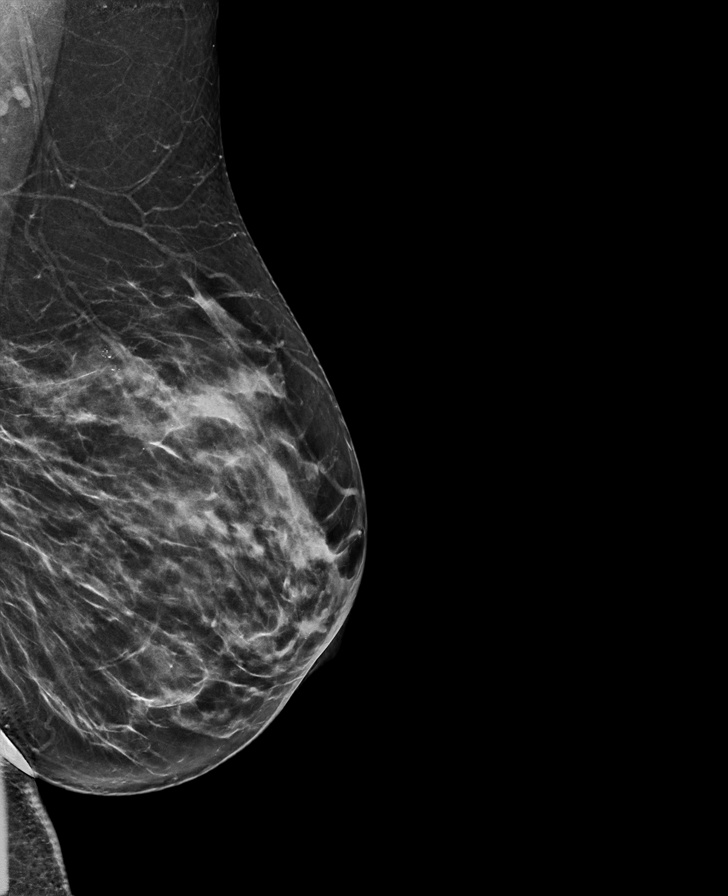

[L MLO synth-2D]
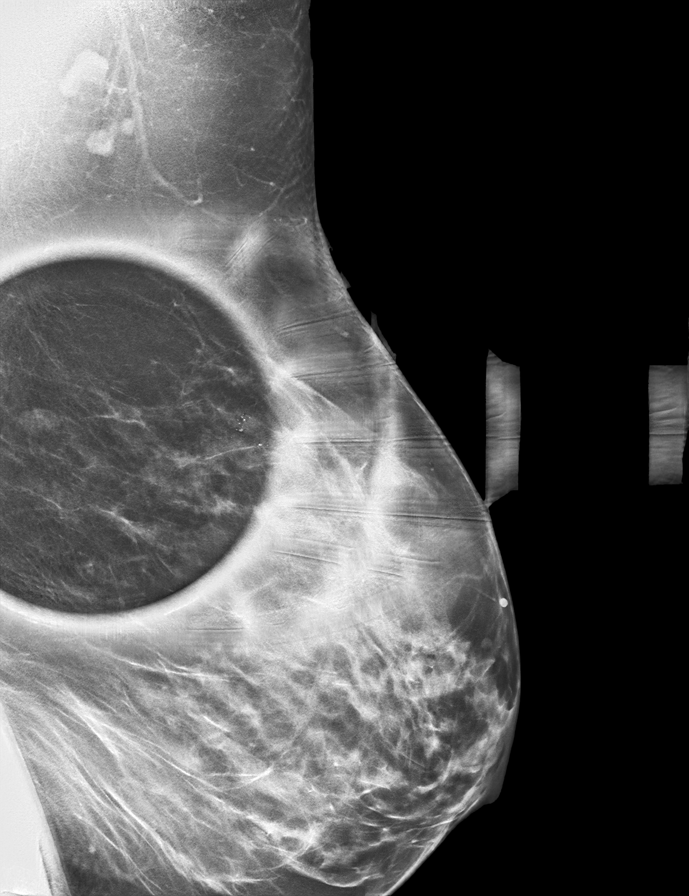

[L XCCL]
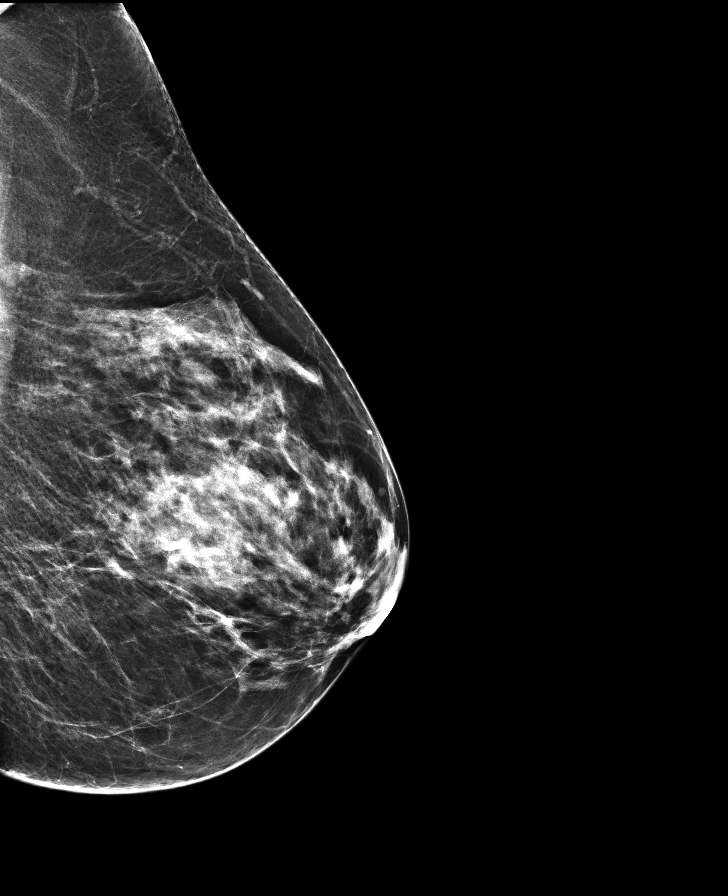

[L ML tomo · tomo slice 35/69.0]
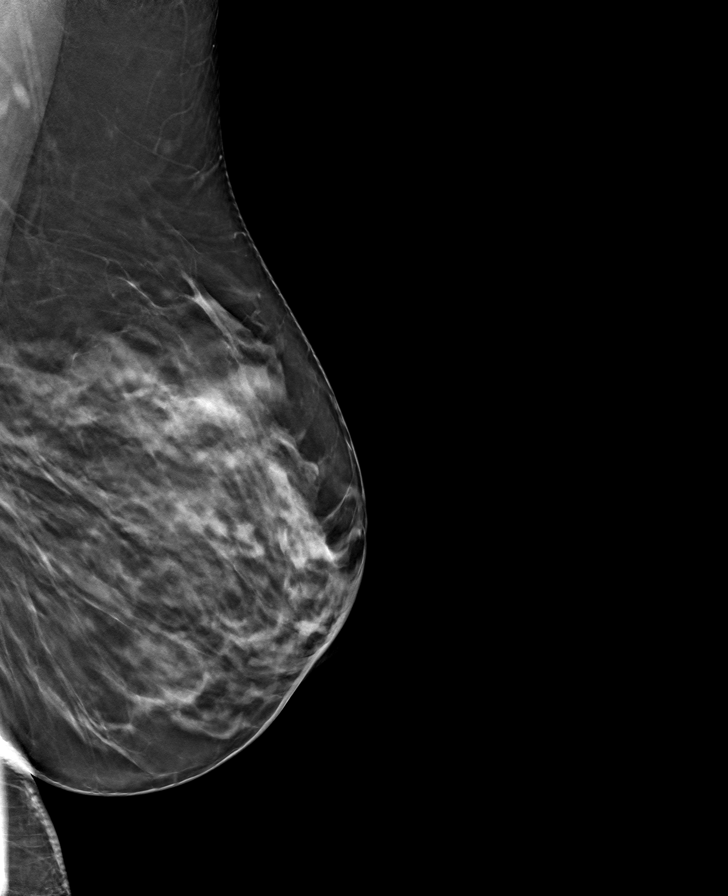

[L MLO tomo · tomo slice 33/66.0]
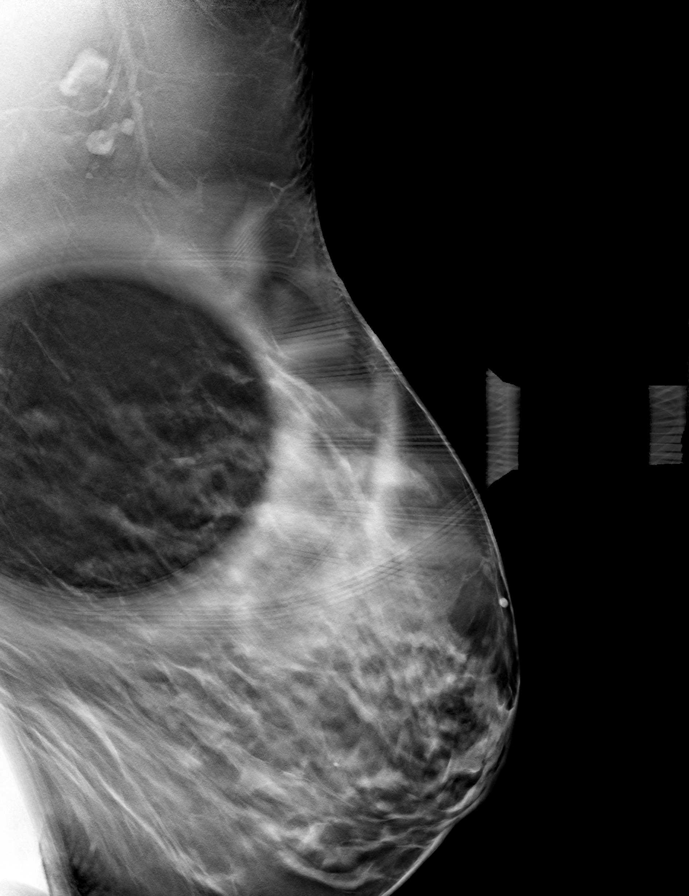

[L XCCL tomo · tomo slice 37/73.0]
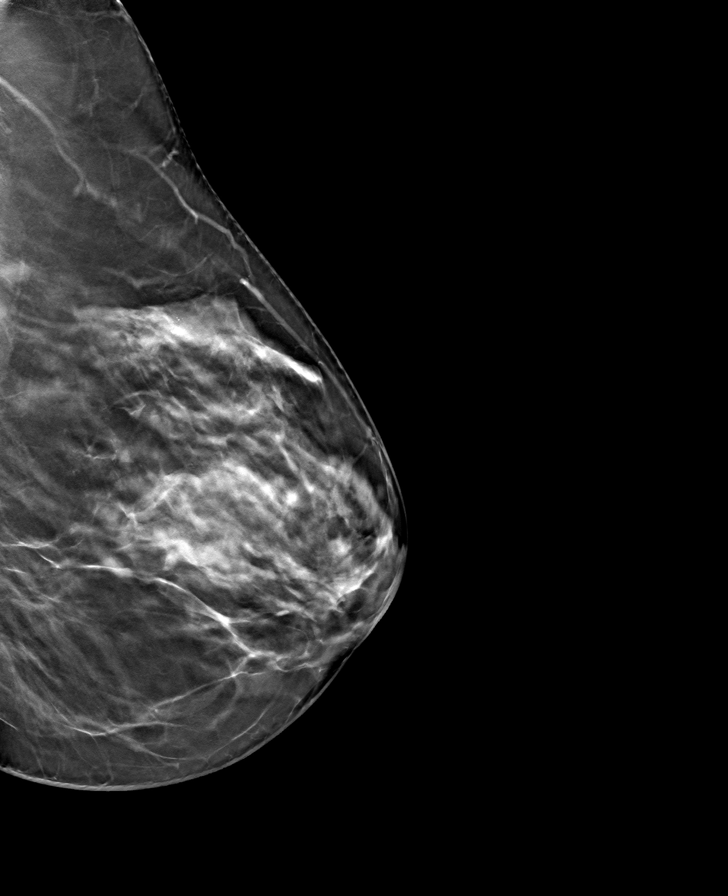

[8 of 20 positions shown; findings below may reference images not displayed]

ACR Breast Density Category c: The breast tissue is heterogeneously
dense, which may obscure small masses.
FINDINGS: The previously noted possible mass persists on additional views as a
0.9 cm oval circumscribed mass in the upper outer breast at far
posterior depth. The mass is not visualized on the full field true
lateral view due to the far posterior location.

Additionally, grouped round and punctate calcifications spanning
cm are noted in the upper outer breast at middle to posterior depth.
The calcifications in the inferior aspect of the group demonstrate
layering on lateral view, consistent with benign milk of calcium.
However, the calcifications at the superior aspect of the group do
not change morphology on lateral view.

Targeted ultrasound of the left breast at the 3 o'clock position 13
cm from the nipple demonstrates a 0.6 x 0.8 x 0.3 cm oval hypoechoic
mass with partially circumscribed, partially indistinct margins,
corresponding to the mammographic finding. There is a small central
hyperechoic area with internal vascularity suggestive of a fatty
hilum.

Targeted ultrasound of the left axilla demonstrates lymph nodes with
normal morphology.
IMPRESSION: 1. Indeterminate mass at the left breast 3 o'clock position may
represent an intramammary lymph node with diffuse cortical
thickening. Recommend ultrasound-guided biopsy for definitive
characterization.
2. Indeterminate grouped calcifications in the upper outer left
breast. Recommend stereotactic guided biopsy.
3. No left axillary lymphadenopathy.

RECOMMENDATION:
Left breast ultrasound-guided biopsy of the mass at the 3 o'clock
position.

Left breast stereotactic guided biopsy of calcifications in the
upper outer breast.

I have discussed the findings and recommendations with the patient.
The biopsy procedures were discussed with the patient and questions
were answered. Patient expressed their understanding of the biopsy
recommendations. We will obtain an order for the procedures from the
patient's clinical provider and schedule the patient at her earliest
convenience.

BI-RADS CATEGORY  4: Suspicious.
